# Patient Record
Sex: Female | Born: 1960 | Race: White | Hispanic: No | Marital: Married | Smoking: Current every day smoker
Health system: Southern US, Community
[De-identification: ages and names within clinical notes are randomized; demographics above are authoritative.]

## PROBLEM LIST (undated history)

## (undated) HISTORY — PX: FOOT SURGERY: SHX648

## (undated) HISTORY — PX: ABDOMINAL HYSTERECTOMY: SHX81

## (undated) HISTORY — PX: TUMOR REMOVAL: SHX12

---

## 2020-08-02 ENCOUNTER — Emergency Department
Admission: EM | Admit: 2020-08-02 | Discharge: 2020-08-02 | Disposition: A | Discharge status: Home / Self Care | Payer: 59 | Attending: Emergency Medicine | Admitting: Emergency Medicine

## 2020-08-02 ENCOUNTER — Emergency Department: Payer: 59

## 2020-08-02 DIAGNOSIS — Y9389 Activity, other specified: Secondary | ICD-10-CM | POA: Insufficient documentation

## 2020-08-02 DIAGNOSIS — Z23 Encounter for immunization: Secondary | ICD-10-CM | POA: Diagnosis not present

## 2020-08-02 DIAGNOSIS — Y9359 Activity, other involving other sports and athletics played individually: Secondary | ICD-10-CM | POA: Diagnosis not present

## 2020-08-02 DIAGNOSIS — W290XXA Contact with powered kitchen appliance, initial encounter: Secondary | ICD-10-CM | POA: Insufficient documentation

## 2020-08-02 DIAGNOSIS — Y92090 Kitchen in other non-institutional residence as the place of occurrence of the external cause: Secondary | ICD-10-CM | POA: Diagnosis not present

## 2020-08-02 DIAGNOSIS — S61311A Laceration without foreign body of left index finger with damage to nail, initial encounter: Secondary | ICD-10-CM | POA: Diagnosis not present

## 2020-08-02 DIAGNOSIS — Y998 Other external cause status: Secondary | ICD-10-CM | POA: Insufficient documentation

## 2020-08-02 MED ORDER — TETANUS-DIPHTH-ACELL PERTUSSIS 5-2.5-18.5 LF-MCG/0.5 IM SUSP
0.5000 mL | Freq: Once | INTRAMUSCULAR | Status: AC
  Administered 2020-08-02: 0.5 mL via INTRAMUSCULAR
  Filled 2020-08-02: qty 0.5

## 2020-08-02 MED ORDER — CEPHALEXIN 500 MG PO CAPS: 500.0000 mg | ORAL_CAPSULE | Freq: Two times a day (BID) | ORAL | 0 refills | Status: AC

## 2020-08-02 NOTE — ED Triage Notes (Signed)
Finger laceration to the left index from 1930 with an emersion blender in kitchen.

## 2020-08-02 NOTE — ED Notes (Signed)
Pt alert and oriented X 4, stable for discharge. RR even and unlabored, color WNL. Discussed discharge instructions and follow up when appropriate. Instructed to follow up with ER for any life threatening symptoms or concerns that patient or family of patient may have  

## 2020-08-02 NOTE — ED Provider Notes (Signed)
[facility] Emergency Department Provider Note  ____________________________________________  Time seen: Approximately 10:33 PM  I have reviewed the triage vital signs and the nursing notes.   HISTORY  Chief Complaint Finger Injury    HPI Donna Duke is a 59 y.o. female who presents the emergency department with a laceration to the left index finger after sustaining an injury while using a immersion blender.  Patient states that she was going to take the blade off when evidently it activated causing a laceration to the index.  She is able to move the finger freely.  It did do damage to the lateral nail bed.  Sensation intact distally.   Full range of motion.  Unsure of last tetanus shot.  No other injury or complaint.        No past medical history on file.  There are no problems to display for this patient.     Prior to Admission medications   Medication Sig Start Date End Date Taking? Authorizing Provider  cephALEXin (KEFLEX) 500 MG capsule Take 1 capsule (500 mg total) by mouth 2 (two) times daily. 08/02/20   Shanyiah Conde, Delorise Royals, PA-C    Allergies Patient has no known allergies.  No family history on file.  Social History Social History   Tobacco Use  . Smoking status: Not on file  Substance Use Topics  . Alcohol use: Not on file  . Drug use: Not on file     Review of Systems  Constitutional: No fever/chills Eyes: No visual changes. No discharge ENT: No upper respiratory complaints. Cardiovascular: no chest pain. Respiratory: no cough. No SOB. Gastrointestinal: No abdominal pain.  No nausea, no vomiting.   Musculoskeletal: Laceration to the left index finger Skin: Negative for rash, abrasions, lacerations, ecchymosis. Neurological: Negative for headaches, focal weakness or numbness. 10-point ROS otherwise negative.  ____________________________________________   PHYSICAL EXAM:  VITAL SIGNS: ED Triage Vitals  Enc Vitals  Group     BP      Pulse      Resp      Temp      Temp src      SpO2      Weight      Height      Head Circumference      Peak Flow      Pain Score      Pain Loc      Pain Edu?      Excl. in GC?      Constitutional: Alert and oriented. Well appearing and in no acute distress. Eyes: Conjunctivae are normal. PERRL. EOMI. Head: Atraumatic. ENT:      Ears:       Nose: No congestion/rhinnorhea.      Mouth/Throat: Mucous membranes are moist.  Neck: No stridor.    Cardiovascular: Normal rate, regular rhythm. Normal S1 and S2.  Good peripheral circulation. Respiratory: Normal respiratory effort without tachypnea or retractions. Lungs CTAB. Good air entry to the bases with no decreased or absent breath sounds. Musculoskeletal: Full range of motion to all extremities. No gross deformities appreciated.  Visualization of the index finger reveals well approximated laceration.  No active bleeding.  This does involve the lateral aspect of the nailbed.  No foreign body.  Nailbed is not loose.  Sensation and capillary refill intact distally.  Full range of motion. Neurologic:  Normal speech and language. No gross focal neurologic deficits are appreciated.  Skin:  Skin is warm, dry and intact. No rash noted. Psychiatric:  Mood and affect are normal. Speech and behavior are normal. Patient exhibits appropriate insight and judgement.   ____________________________________________   LABS (all labs ordered are listed, but only abnormal results are displayed)  Labs Reviewed - No data to display ____________________________________________  EKG   ____________________________________________  RADIOLOGY   DG Finger Index Left  Result Date: 08/02/2020 CLINICAL DATA:  Finger laceration EXAM: LEFT INDEX FINGER 2+V COMPARISON:  None. FINDINGS: There is no evidence of fracture or dislocation. There is no evidence of arthropathy or other focal bone abnormality. Soft tissues are unremarkable. No  radiopaque foreign body. IMPRESSION: No fracture or foreign body. Electronically Signed   By: Charlett Nose M.D.   On: 08/02/2020 22:12    ____________________________________________    PROCEDURES  Procedure(s) performed:    Marland KitchenMarland KitchenLaceration Repair  Date/Time: 08/02/2020 11:00 PM Performed by: Racheal Patches, PA-C Authorized by: Racheal Patches, PA-C   Consent:    Consent obtained:  Verbal   Consent given by:  Patient   Risks discussed:  Pain and infection Anesthesia (see MAR for exact dosages):    Anesthesia method:  None Laceration details:    Location:  Finger   Finger location:  L index finger   Length (cm):  1.5 Repair type:    Repair type:  Simple Exploration:    Hemostasis achieved with:  Direct pressure   Wound exploration: wound explored through full range of motion and entire depth of wound probed and visualized     Wound extent: no foreign bodies/material noted, no muscle damage noted, no nerve damage noted, no tendon damage noted, no underlying fracture noted and no vascular damage noted     Contaminated: no   Treatment:    Area cleansed with:  Shur-Clens   Amount of cleaning:  Standard Skin repair:    Repair method:  Tissue adhesive Approximation:    Approximation:  Close Post-procedure details:    Dressing:  Splint for protection   Patient tolerance of procedure:  Tolerated well, no immediate complications      Medications  Tdap (BOOSTRIX) injection 0.5 mL (0.5 mLs Intramuscular Given 08/02/20 2300)     ____________________________________________   INITIAL IMPRESSION / ASSESSMENT AND PLAN / ED COURSE  Pertinent labs & imaging results that were available during my care of the patient were reviewed by me and considered in my medical decision making (see chart for details).  Review of the [place] CSRS was performed in accordance of the [facility] prior to dispensing any controlled drugs.           Patient's diagnosis is consistent with  finger laceration.  Patient presented to emergency department after sustaining a laceration to the index finger the left hand.  This was amenable to closure with Dermabond and splint for protection.  Patient is given wound care instructions.  Antibiotics prophylactically.  Patient will have tetanus shot updated at this time.   Return precautions discussed with the patient.. Patient will be discharged home with prescriptions for Keflex. Patient is to follow up with primary care as needed or otherwise directed. Patient is given ED precautions to return to the ED for any worsening or new symptoms.     ____________________________________________  FINAL CLINICAL IMPRESSION(S) / ED DIAGNOSES  Final diagnoses:  Laceration of left index finger without foreign body with damage to nail, initial encounter      NEW MEDICATIONS STARTED DURING THIS VISIT:  ED Discharge Orders         Ordered    cephALEXin (  KEFLEX) 500 MG capsule  2 times daily     Discontinue  Reprint     08/02/20 2257              This chart was dictated using voice recognition software/Dragon. Despite best efforts to proofread, errors can occur which can change the meaning. Any change was purely unintentional.    Racheal Patches, PA-C 08/02/20 2301    Arnaldo Natal, MD 08/02/20 [phone]

## 2021-02-05 ENCOUNTER — Telehealth: Payer: Self-pay | Admitting: *Deleted

## 2021-02-05 NOTE — Telephone Encounter (Signed)
Received referral for low dose lung cancer screening CT scan. Message left at phone number listed in EMR for patient to call me back to facilitate scheduling scan.  

## 2021-03-12 ENCOUNTER — Telehealth: Payer: Self-pay | Admitting: *Deleted

## 2021-03-12 NOTE — Telephone Encounter (Signed)
Received referral for low dose lung cancer screening CT scan. Message left at phone number listed in EMR for patient to call me back to facilitate scheduling scan.  

## 2021-03-14 ENCOUNTER — Telehealth: Payer: Self-pay | Admitting: *Deleted

## 2021-03-14 DIAGNOSIS — Z87891 Personal history of nicotine dependence: Secondary | ICD-10-CM

## 2021-03-14 DIAGNOSIS — F17200 Nicotine dependence, unspecified, uncomplicated: Secondary | ICD-10-CM

## 2021-03-14 DIAGNOSIS — Z122 Encounter for screening for malignant neoplasm of respiratory organs: Secondary | ICD-10-CM

## 2021-03-14 NOTE — Telephone Encounter (Signed)
Received referral for initial lung cancer screening scan. Contacted patient and obtained smoking history,(current, (30 pack year) as well as answering questions related to screening process. Patient denies signs of lung cancer such as weight loss or hemoptysis. Patient denies comorbidity that would prevent curative treatment if lung cancer were found. Patient is scheduled for shared decision making visit and CT scan on 03/20/21 at 145pm.

## 2021-03-20 ENCOUNTER — Ambulatory Visit: Admission: RE | Admit: 2021-03-20 | Payer: 59 | Source: Home / Self Care

## 2021-03-20 ENCOUNTER — Inpatient Hospital Stay: Payer: 59 | Attending: Nurse Practitioner | Admitting: Nurse Practitioner

## 2021-03-20 ENCOUNTER — Ambulatory Visit
Admission: RE | Admit: 2021-03-20 | Discharge: 2021-03-20 | Disposition: A | Discharge status: Home / Self Care | Payer: 59 | Source: Ambulatory Visit | Attending: Nurse Practitioner | Admitting: Nurse Practitioner

## 2021-03-20 ENCOUNTER — Ambulatory Visit: Admit: 2021-03-20 | Payer: 59

## 2021-03-20 ENCOUNTER — Other Ambulatory Visit: Payer: Self-pay

## 2021-03-20 DIAGNOSIS — F17200 Nicotine dependence, unspecified, uncomplicated: Secondary | ICD-10-CM | POA: Diagnosis present

## 2021-03-20 DIAGNOSIS — Z122 Encounter for screening for malignant neoplasm of respiratory organs: Secondary | ICD-10-CM | POA: Diagnosis present

## 2021-03-20 DIAGNOSIS — Z87891 Personal history of nicotine dependence: Secondary | ICD-10-CM | POA: Diagnosis present

## 2021-03-20 NOTE — Progress Notes (Signed)
Virtual Visit via Video Enabled Telemedicine Note   I connected with Donna Duke on 03/20/21 at 1:45 PM EST by video enabled telemedicine visit and verified that I am speaking with the correct person using two identifiers.   I discussed the limitations, risks, security and privacy concerns of performing an evaluation and management service by telemedicine and the availability of in-person appointments. I also discussed with the patient that there may be a patient responsible charge related to this service. The patient expressed understanding and agreed to proceed.   Other persons participating in the visit and their role in the encounter: Donna Estelle, RN- checking in patient & navigation  Patient's location: [place] [facility] [place] location: Clinic  Chief Complaint: Low Dose CT Screening  Patient agreed to evaluation by telemedicine to discuss shared decision making for consideration of low dose CT lung cancer screening.    In accordance with CMS guidelines, patient has met eligibility criteria including age, absence of signs or symptoms of lung cancer.  Social History   Tobacco Use  . Smoking status: Current Every Day Smoker    Packs/day: 0.75    Years: 40.00    Pack years: 30.00    Types: Cigarettes  . Smokeless tobacco: Not on file  Substance Use Topics  . Alcohol use: Not on file     A shared decision-making session was conducted prior to the performance of CT scan. This includes one or more decision aids, includes benefits and harms of screening, follow-up diagnostic testing, over-diagnosis, false positive rate, and total radiation exposure.   Counseling on the importance of adherence to annual lung cancer LDCT screening, impact of co-morbidities, and ability or willingness to undergo diagnosis and treatment is imperative for compliance of the program.   Counseling on the importance of continued smoking cessation for former smokers; the importance of smoking  cessation for current smokers, and information about tobacco cessation interventions have been given to patient including [place] and [employer] [place] programs.   Written order for lung cancer screening with LDCT has been given to the patient and any and all questions have been answered to the best of my abilities.    Yearly follow up will be coordinated by Donna Duke, Thoracic Navigator.  I discussed the assessment and treatment plan with the patient. The patient was provided an opportunity to ask questions and all were answered. The patient agreed with the plan and demonstrated an understanding of the instructions.   The patient was advised to call back or seek an in-person evaluation if the symptoms worsen or if the condition fails to improve as anticipated.   I provided 15 minutes of face-to-face video visit time dedicated to the care of this patient on the date of this encounter to include pre-visit review of smoking history, face-to-face time with the patient, and post visit ordering of testing/documentation.   Donna Rutter, DNP, AGNP-C [place] at [facility] [phone] (clinic)

## 2021-03-28 ENCOUNTER — Telehealth: Payer: Self-pay | Admitting: *Deleted

## 2021-03-28 NOTE — Telephone Encounter (Signed)

## 2021-12-20 IMAGING — CT CT CHEST LUNG CANCER SCREENING LOW DOSE W/O CM
1 series · 15 of 31 positions shown, 19 images · non-contrast
Comparison: None.

CLINICAL DATA: 30 pack-year smoking history.  Current smoker.

EXAM:
CT CHEST WITHOUT CONTRAST LOW-DOSE FOR LUNG CANCER SCREENING
TECHNIQUE: Multidetector CT imaging of the chest was performed following the
standard protocol without IV contrast.

[Series 2: axial st · axial · 0.72mm/px · z∈[-889,-624]mm · 15 of 59 slices shown, 19 images]
[im 3/59  mediastinal]
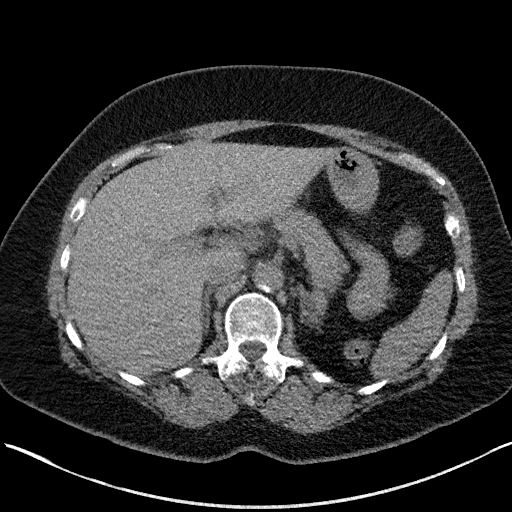
[im 3/59  lung]
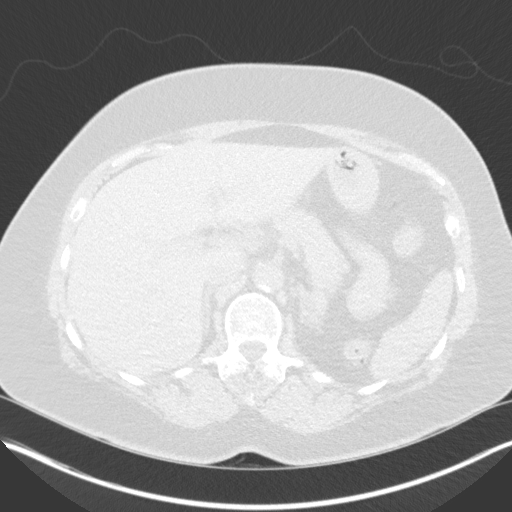
[im 7/59  lung]
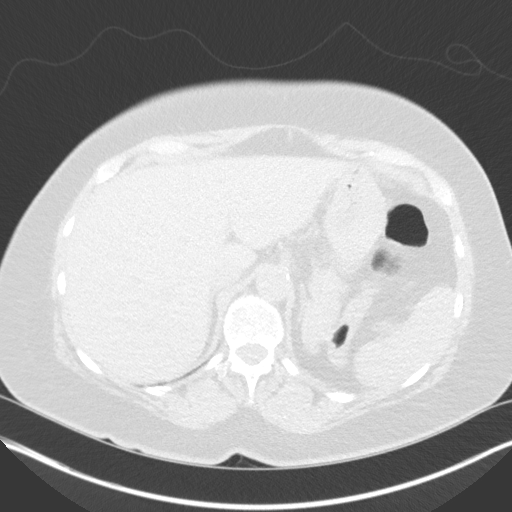
[im 11/59  lung]
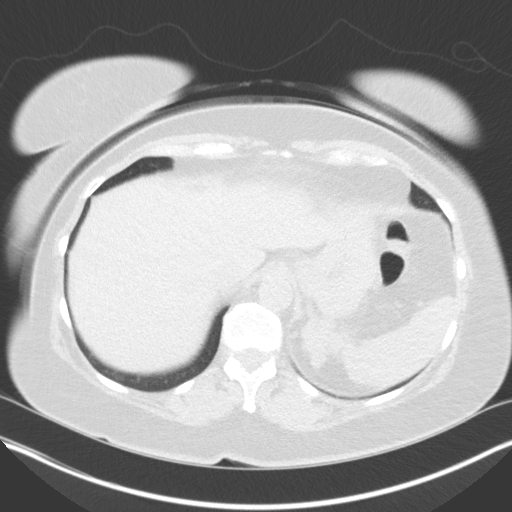
[im 13/59  lung]
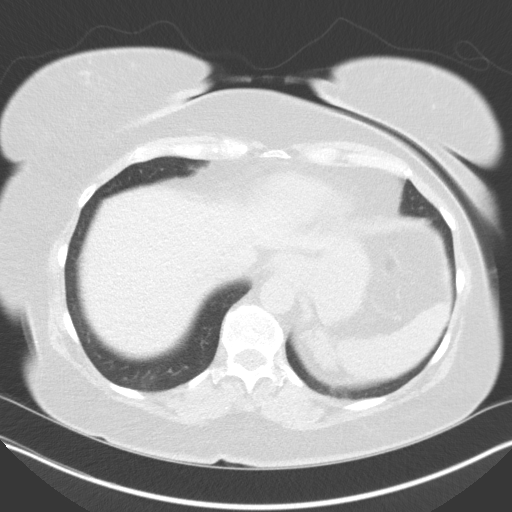
[im 18/59  mediastinal]
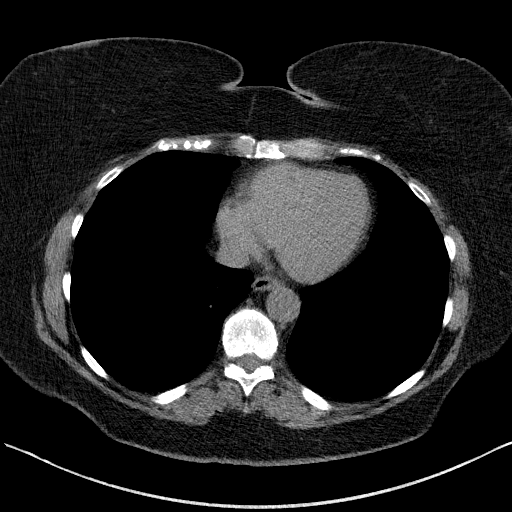
[im 18/59  lung]
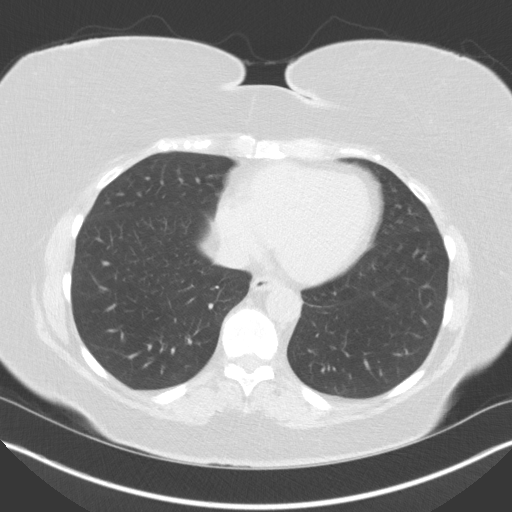
[im 22/59  lung]
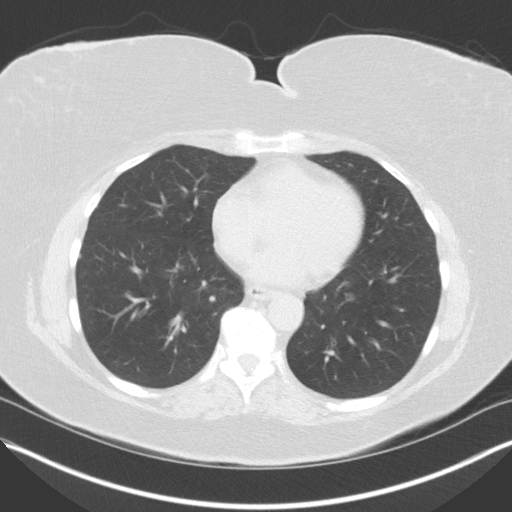
[im 26/59  lung]
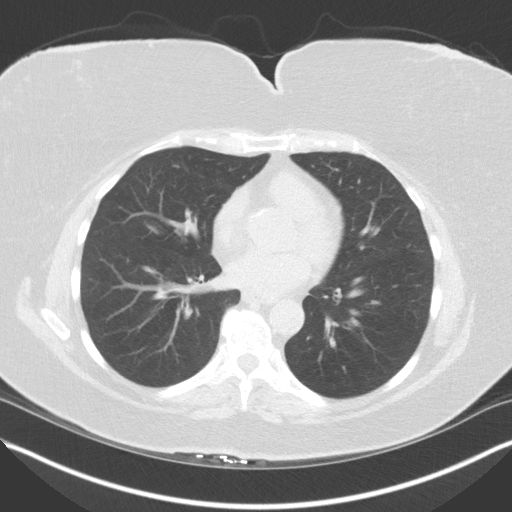
[im 31/59  lung]
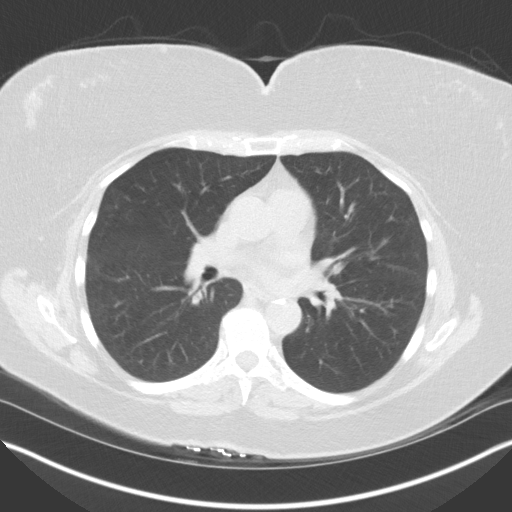
[im 33/59  mediastinal]
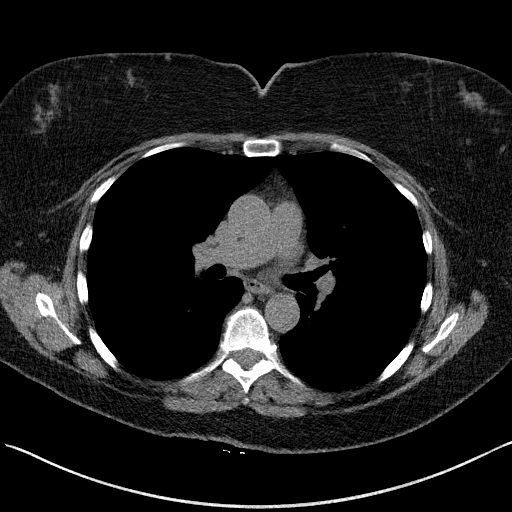
[im 33/59  lung]
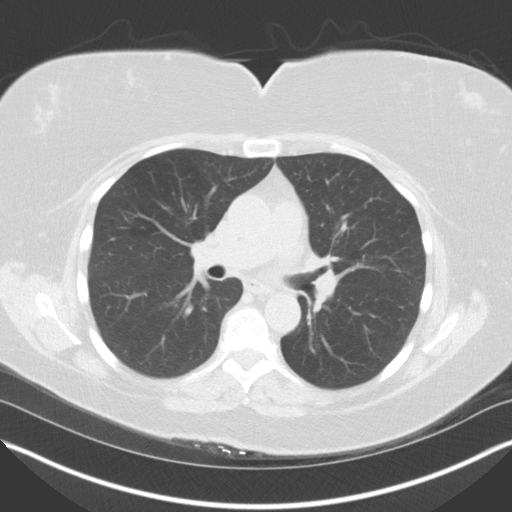
[im 37/59  lung]
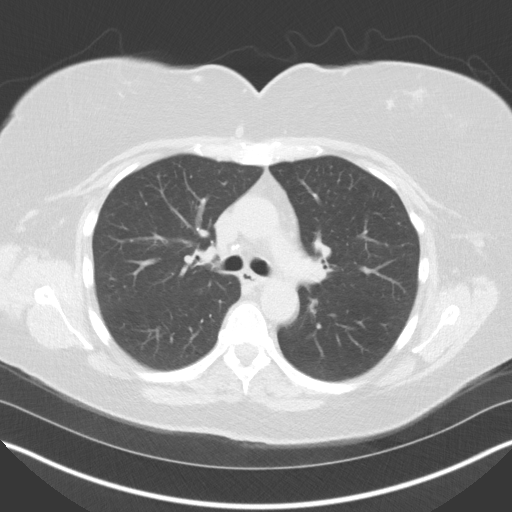
[im 41/59  lung]
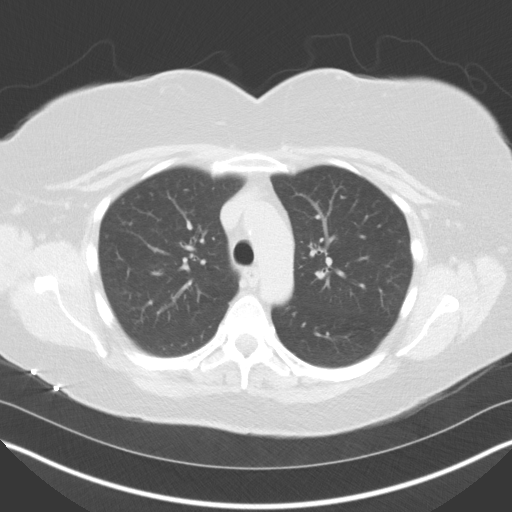
[im 46/59  lung]
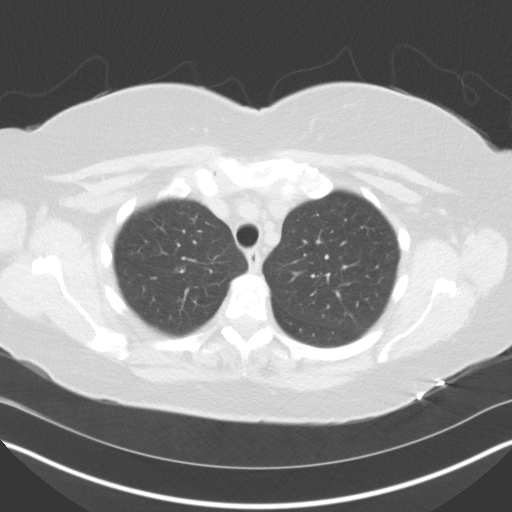
[im 48/59  mediastinal]
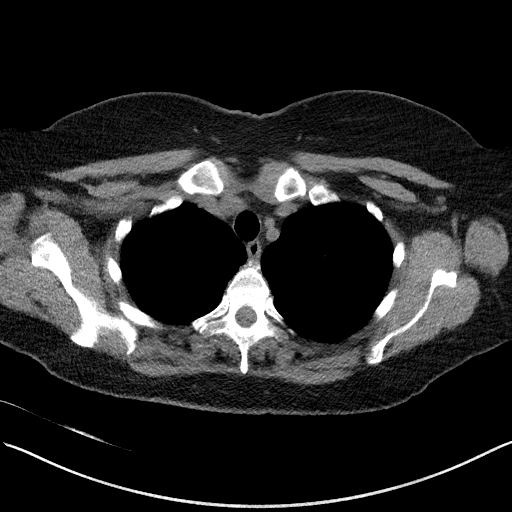
[im 48/59  lung]
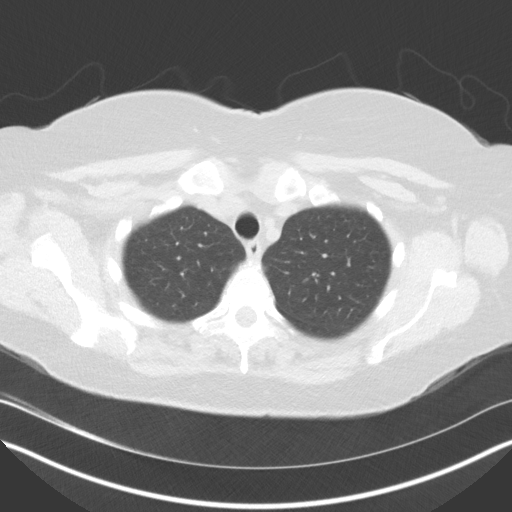
[im 52/59  lung]
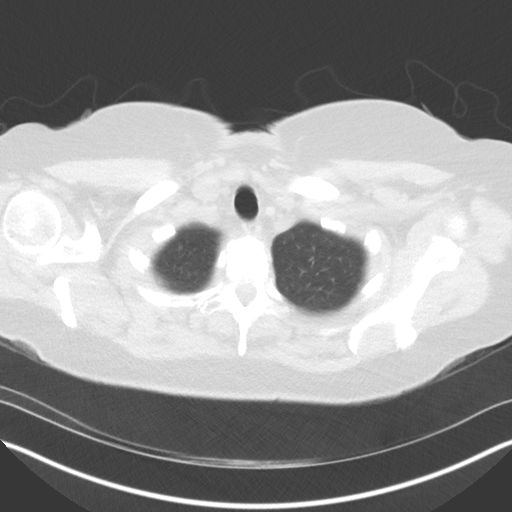
[im 56/59  lung]
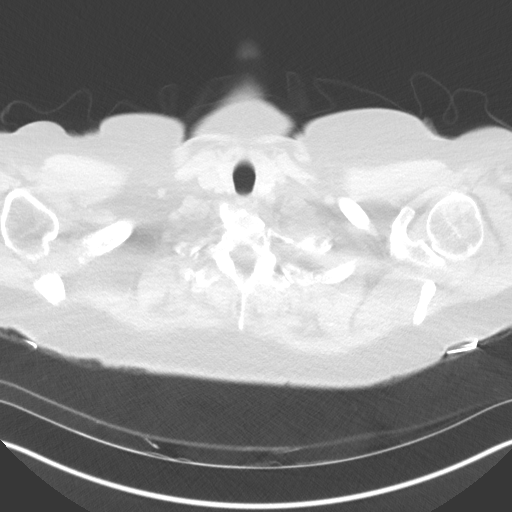

[15 of 31 positions shown; findings below may reference images not displayed]

FINDINGS: Cardiovascular: Aortic atherosclerosis. Normal heart size, without
pericardial effusion.

Mediastinum/Nodes: Calcified middle mediastinal node is likely
related to old granulomatous disease. Hilar regions poorly evaluated
without intravenous contrast.

Lungs/Pleura: No pleural fluid. mild centrilobular emphysema.
Subpleural predominant pulmonary nodules may represent subpleural
lymph nodes. These measure maximally volume derived equivalent
diameter 5.2 mm.

Upper Abdomen: Normal imaged portions of the liver, spleen, stomach,
pancreas, adrenal glands.

Musculoskeletal: No acute osseous abnormality.
IMPRESSION: 1. Lung-RADS 2, benign appearance or behavior. Continue annual
screening with low-dose chest CT without contrast in 12 months.
2. Aortic Atherosclerosis (RY5FS-OMH.H) and Emphysema (RY5FS-599.X).

## 2022-03-22 ENCOUNTER — Telehealth: Payer: Self-pay | Admitting: *Deleted

## 2022-03-22 NOTE — Telephone Encounter (Signed)
LMTC to schedule Yearly Lung CA CT Scan. 

## 2022-05-17 ENCOUNTER — Other Ambulatory Visit: Payer: Self-pay | Admitting: Infectious Diseases

## 2022-05-17 DIAGNOSIS — Z1231 Encounter for screening mammogram for malignant neoplasm of breast: Secondary | ICD-10-CM

## 2022-05-31 ENCOUNTER — Telehealth: Payer: Self-pay | Admitting: *Deleted

## 2022-05-31 NOTE — Telephone Encounter (Signed)
Left another message for pt to call back to schedule yearly lung screening CT scan.  ?

## 2022-06-05 ENCOUNTER — Ambulatory Visit
Admission: RE | Admit: 2022-06-05 | Discharge: 2022-06-05 | Disposition: A | Discharge status: Home / Self Care | Payer: BC Managed Care – PPO | Source: Ambulatory Visit | Attending: Infectious Diseases | Admitting: Infectious Diseases

## 2022-06-05 ENCOUNTER — Encounter: Payer: Self-pay | Admitting: Radiology

## 2022-06-05 DIAGNOSIS — Z1231 Encounter for screening mammogram for malignant neoplasm of breast: Secondary | ICD-10-CM | POA: Insufficient documentation

## 2022-06-24 ENCOUNTER — Inpatient Hospital Stay
Admission: RE | Admit: 2022-06-24 | Discharge: 2022-06-24 | Disposition: A | Discharge status: Home / Self Care | Payer: Self-pay | Source: Ambulatory Visit | Attending: *Deleted | Admitting: *Deleted

## 2022-06-24 ENCOUNTER — Other Ambulatory Visit: Payer: Self-pay | Admitting: *Deleted

## 2022-06-24 DIAGNOSIS — Z1231 Encounter for screening mammogram for malignant neoplasm of breast: Secondary | ICD-10-CM

## 2022-06-26 ENCOUNTER — Telehealth: Payer: Self-pay | Admitting: *Deleted

## 2022-06-26 ENCOUNTER — Other Ambulatory Visit: Payer: Self-pay | Admitting: Infectious Diseases

## 2022-06-26 DIAGNOSIS — R928 Other abnormal and inconclusive findings on diagnostic imaging of breast: Secondary | ICD-10-CM

## 2022-06-26 DIAGNOSIS — N6489 Other specified disorders of breast: Secondary | ICD-10-CM

## 2022-06-26 NOTE — Telephone Encounter (Signed)
Left another message for patient to call back to schedule follow up lung cancer screening CT scan.

## 2022-07-01 ENCOUNTER — Other Ambulatory Visit: Payer: Self-pay

## 2022-07-01 DIAGNOSIS — Z122 Encounter for screening for malignant neoplasm of respiratory organs: Secondary | ICD-10-CM

## 2022-07-01 DIAGNOSIS — F17210 Nicotine dependence, cigarettes, uncomplicated: Secondary | ICD-10-CM

## 2022-07-01 DIAGNOSIS — Z87891 Personal history of nicotine dependence: Secondary | ICD-10-CM

## 2022-07-16 ENCOUNTER — Ambulatory Visit
Admission: RE | Admit: 2022-07-16 | Discharge: 2022-07-16 | Disposition: A | Discharge status: Home / Self Care | Payer: BC Managed Care – PPO | Source: Ambulatory Visit | Attending: Acute Care | Admitting: Acute Care

## 2022-07-16 DIAGNOSIS — I700 Atherosclerosis of aorta: Secondary | ICD-10-CM | POA: Diagnosis not present

## 2022-07-16 DIAGNOSIS — I2510 Atherosclerotic heart disease of native coronary artery without angina pectoris: Secondary | ICD-10-CM | POA: Insufficient documentation

## 2022-07-16 DIAGNOSIS — J439 Emphysema, unspecified: Secondary | ICD-10-CM | POA: Insufficient documentation

## 2022-07-16 DIAGNOSIS — Z122 Encounter for screening for malignant neoplasm of respiratory organs: Secondary | ICD-10-CM | POA: Insufficient documentation

## 2022-07-16 DIAGNOSIS — Z87891 Personal history of nicotine dependence: Secondary | ICD-10-CM

## 2022-07-16 DIAGNOSIS — F17210 Nicotine dependence, cigarettes, uncomplicated: Secondary | ICD-10-CM | POA: Diagnosis not present

## 2022-07-17 ENCOUNTER — Ambulatory Visit
Admission: RE | Admit: 2022-07-17 | Discharge: 2022-07-17 | Disposition: A | Discharge status: Home / Self Care | Payer: BC Managed Care – PPO | Source: Ambulatory Visit | Attending: Infectious Diseases | Admitting: Infectious Diseases

## 2022-07-17 ENCOUNTER — Other Ambulatory Visit: Payer: Self-pay

## 2022-07-17 DIAGNOSIS — F17210 Nicotine dependence, cigarettes, uncomplicated: Secondary | ICD-10-CM

## 2022-07-17 DIAGNOSIS — R928 Other abnormal and inconclusive findings on diagnostic imaging of breast: Secondary | ICD-10-CM

## 2022-07-17 DIAGNOSIS — N6489 Other specified disorders of breast: Secondary | ICD-10-CM | POA: Diagnosis present

## 2022-07-17 DIAGNOSIS — Z122 Encounter for screening for malignant neoplasm of respiratory organs: Secondary | ICD-10-CM

## 2022-07-17 DIAGNOSIS — Z87891 Personal history of nicotine dependence: Secondary | ICD-10-CM

## 2022-08-15 NOTE — Addendum Note (Signed)
Encounter addended by: Silvana Newness on: 08/15/2022 11:01 AM  Actions taken: Imaging Exam ended

## 2023-05-20 ENCOUNTER — Other Ambulatory Visit: Payer: Self-pay | Admitting: Infectious Diseases

## 2023-05-20 DIAGNOSIS — Z1231 Encounter for screening mammogram for malignant neoplasm of breast: Secondary | ICD-10-CM

## 2023-05-23 ENCOUNTER — Other Ambulatory Visit: Payer: Self-pay | Admitting: Acute Care

## 2023-05-23 DIAGNOSIS — Z122 Encounter for screening for malignant neoplasm of respiratory organs: Secondary | ICD-10-CM

## 2023-05-23 DIAGNOSIS — Z87891 Personal history of nicotine dependence: Secondary | ICD-10-CM

## 2023-05-23 DIAGNOSIS — F17210 Nicotine dependence, cigarettes, uncomplicated: Secondary | ICD-10-CM

## 2023-06-17 LAB — COLOGUARD: COLOGUARD: NEGATIVE

## 2023-07-23 ENCOUNTER — Ambulatory Visit
Admission: RE | Admit: 2023-07-23 | Discharge: 2023-07-23 | Disposition: A | Discharge status: Home / Self Care | Payer: BC Managed Care – PPO | Source: Ambulatory Visit | Attending: Infectious Diseases | Admitting: Infectious Diseases

## 2023-07-23 DIAGNOSIS — Z1231 Encounter for screening mammogram for malignant neoplasm of breast: Secondary | ICD-10-CM | POA: Diagnosis present

## 2023-07-29 ENCOUNTER — Ambulatory Visit
Admission: RE | Admit: 2023-07-29 | Discharge: 2023-07-29 | Disposition: A | Discharge status: Home / Self Care | Payer: BC Managed Care – PPO | Source: Ambulatory Visit | Attending: Infectious Diseases | Admitting: Infectious Diseases

## 2023-07-29 DIAGNOSIS — I2510 Atherosclerotic heart disease of native coronary artery without angina pectoris: Secondary | ICD-10-CM | POA: Insufficient documentation

## 2023-07-29 DIAGNOSIS — Z87891 Personal history of nicotine dependence: Secondary | ICD-10-CM

## 2023-07-29 DIAGNOSIS — I700 Atherosclerosis of aorta: Secondary | ICD-10-CM | POA: Diagnosis not present

## 2023-07-29 DIAGNOSIS — F17210 Nicotine dependence, cigarettes, uncomplicated: Secondary | ICD-10-CM | POA: Diagnosis not present

## 2023-07-29 DIAGNOSIS — J439 Emphysema, unspecified: Secondary | ICD-10-CM | POA: Insufficient documentation

## 2023-07-29 DIAGNOSIS — Z122 Encounter for screening for malignant neoplasm of respiratory organs: Secondary | ICD-10-CM | POA: Insufficient documentation

## 2023-08-04 ENCOUNTER — Other Ambulatory Visit: Payer: Self-pay

## 2023-08-04 DIAGNOSIS — Z87891 Personal history of nicotine dependence: Secondary | ICD-10-CM

## 2023-08-04 DIAGNOSIS — Z122 Encounter for screening for malignant neoplasm of respiratory organs: Secondary | ICD-10-CM

## 2023-08-04 DIAGNOSIS — F17210 Nicotine dependence, cigarettes, uncomplicated: Secondary | ICD-10-CM

## 2024-05-21 ENCOUNTER — Other Ambulatory Visit: Payer: Self-pay | Admitting: Infectious Diseases

## 2024-05-21 DIAGNOSIS — K219 Gastro-esophageal reflux disease without esophagitis: Secondary | ICD-10-CM

## 2024-05-21 DIAGNOSIS — R7303 Prediabetes: Secondary | ICD-10-CM

## 2024-05-21 DIAGNOSIS — Z1231 Encounter for screening mammogram for malignant neoplasm of breast: Secondary | ICD-10-CM

## 2024-05-21 DIAGNOSIS — Z1331 Encounter for screening for depression: Secondary | ICD-10-CM

## 2024-05-21 DIAGNOSIS — I2510 Atherosclerotic heart disease of native coronary artery without angina pectoris: Secondary | ICD-10-CM

## 2024-05-21 DIAGNOSIS — I10 Essential (primary) hypertension: Secondary | ICD-10-CM

## 2024-05-21 DIAGNOSIS — Z0000 Encounter for general adult medical examination without abnormal findings: Secondary | ICD-10-CM

## 2024-05-21 DIAGNOSIS — E785 Hyperlipidemia, unspecified: Secondary | ICD-10-CM

## 2024-05-28 ENCOUNTER — Ambulatory Visit
Admission: RE | Admit: 2024-05-28 | Discharge: 2024-05-28 | Disposition: A | Discharge status: Home / Self Care | Payer: Self-pay | Source: Ambulatory Visit | Attending: Infectious Diseases | Admitting: Infectious Diseases

## 2024-05-28 DIAGNOSIS — Z0000 Encounter for general adult medical examination without abnormal findings: Secondary | ICD-10-CM | POA: Insufficient documentation

## 2024-05-28 DIAGNOSIS — I2510 Atherosclerotic heart disease of native coronary artery without angina pectoris: Secondary | ICD-10-CM | POA: Insufficient documentation

## 2024-05-28 DIAGNOSIS — Z1231 Encounter for screening mammogram for malignant neoplasm of breast: Secondary | ICD-10-CM | POA: Insufficient documentation

## 2024-05-28 DIAGNOSIS — K219 Gastro-esophageal reflux disease without esophagitis: Secondary | ICD-10-CM | POA: Insufficient documentation

## 2024-05-28 DIAGNOSIS — R7303 Prediabetes: Secondary | ICD-10-CM | POA: Insufficient documentation

## 2024-05-28 DIAGNOSIS — I10 Essential (primary) hypertension: Secondary | ICD-10-CM | POA: Insufficient documentation

## 2024-05-28 DIAGNOSIS — E785 Hyperlipidemia, unspecified: Secondary | ICD-10-CM | POA: Insufficient documentation

## 2024-05-28 DIAGNOSIS — Z1331 Encounter for screening for depression: Secondary | ICD-10-CM | POA: Insufficient documentation

## 2024-07-15 ENCOUNTER — Encounter: Payer: Self-pay | Admitting: Acute Care

## 2024-07-23 ENCOUNTER — Ambulatory Visit
Admission: RE | Admit: 2024-07-23 | Discharge: 2024-07-23 | Disposition: A | Discharge status: Home / Self Care | Source: Ambulatory Visit | Attending: Infectious Diseases | Admitting: Infectious Diseases

## 2024-07-23 DIAGNOSIS — Z1231 Encounter for screening mammogram for malignant neoplasm of breast: Secondary | ICD-10-CM | POA: Insufficient documentation

## 2024-07-28 ENCOUNTER — Other Ambulatory Visit: Payer: Self-pay | Admitting: Acute Care

## 2024-07-28 DIAGNOSIS — Z87891 Personal history of nicotine dependence: Secondary | ICD-10-CM

## 2024-07-28 DIAGNOSIS — F17210 Nicotine dependence, cigarettes, uncomplicated: Secondary | ICD-10-CM

## 2024-07-28 DIAGNOSIS — Z122 Encounter for screening for malignant neoplasm of respiratory organs: Secondary | ICD-10-CM

## 2024-08-06 ENCOUNTER — Ambulatory Visit
Admission: RE | Admit: 2024-08-06 | Discharge: 2024-08-06 | Disposition: A | Discharge status: Home / Self Care | Source: Ambulatory Visit | Attending: Infectious Diseases | Admitting: Infectious Diseases

## 2024-08-06 DIAGNOSIS — F17210 Nicotine dependence, cigarettes, uncomplicated: Secondary | ICD-10-CM | POA: Diagnosis present

## 2024-08-06 DIAGNOSIS — Z122 Encounter for screening for malignant neoplasm of respiratory organs: Secondary | ICD-10-CM | POA: Insufficient documentation

## 2024-08-06 DIAGNOSIS — Z87891 Personal history of nicotine dependence: Secondary | ICD-10-CM | POA: Insufficient documentation

## 2024-08-24 ENCOUNTER — Other Ambulatory Visit: Payer: Self-pay

## 2024-08-24 DIAGNOSIS — Z87891 Personal history of nicotine dependence: Secondary | ICD-10-CM

## 2024-08-24 DIAGNOSIS — Z122 Encounter for screening for malignant neoplasm of respiratory organs: Secondary | ICD-10-CM

## 2024-08-24 DIAGNOSIS — F17210 Nicotine dependence, cigarettes, uncomplicated: Secondary | ICD-10-CM

## 2024-12-10 ENCOUNTER — Other Ambulatory Visit: Payer: Self-pay

## 2024-12-10 ENCOUNTER — Emergency Department
Admission: EM | Admit: 2024-12-10 | Discharge: 2024-12-10 | Disposition: A | Discharge status: Home / Self Care | Attending: Emergency Medicine | Admitting: Emergency Medicine

## 2024-12-10 ENCOUNTER — Emergency Department

## 2024-12-10 DIAGNOSIS — R079 Chest pain, unspecified: Secondary | ICD-10-CM | POA: Insufficient documentation

## 2024-12-10 DIAGNOSIS — I10 Essential (primary) hypertension: Secondary | ICD-10-CM | POA: Insufficient documentation

## 2024-12-10 DIAGNOSIS — R531 Weakness: Secondary | ICD-10-CM | POA: Insufficient documentation

## 2024-12-10 DIAGNOSIS — Z87891 Personal history of nicotine dependence: Secondary | ICD-10-CM | POA: Diagnosis not present

## 2024-12-10 DIAGNOSIS — R0602 Shortness of breath: Secondary | ICD-10-CM | POA: Diagnosis not present

## 2024-12-10 LAB — CBC
HCT: 41.4 % (ref 36.0–46.0)
Hemoglobin: 13.4 g/dL (ref 12.0–15.0)
MCH: 29.4 pg (ref 26.0–34.0)
MCHC: 32.4 g/dL (ref 30.0–36.0)
MCV: 90.8 fL (ref 80.0–100.0)
Platelets: 210 K/uL (ref 150–400)
RBC: 4.56 MIL/uL (ref 3.87–5.11)
RDW: 12.2 % (ref 11.5–15.5)
WBC: 5.7 K/uL (ref 4.0–10.5)
nRBC: 0 % (ref 0.0–0.2)

## 2024-12-10 LAB — HEPATIC FUNCTION PANEL
ALT: 35 U/L (ref 0–44)
AST: 23 U/L (ref 15–41)
Albumin: 4.3 g/dL (ref 3.5–5.0)
Alkaline Phosphatase: 66 U/L (ref 38–126)
Bilirubin, Direct: 0.2 mg/dL (ref 0.0–0.2)
Indirect Bilirubin: 0.2 mg/dL — ABNORMAL LOW (ref 0.3–0.9)
Total Bilirubin: 0.4 mg/dL (ref 0.0–1.2)
Total Protein: 6.8 g/dL (ref 6.5–8.1)

## 2024-12-10 LAB — BASIC METABOLIC PANEL WITH GFR
Anion gap: 11 (ref 5–15)
BUN: 13 mg/dL (ref 8–23)
CO2: 27 mmol/L (ref 22–32)
Calcium: 9.6 mg/dL (ref 8.9–10.3)
Chloride: 104 mmol/L (ref 98–111)
Creatinine, Ser: 0.7 mg/dL (ref 0.44–1.00)
GFR, Estimated: >60 mL/min
Glucose, Bld: 93 mg/dL (ref 70–99)
Potassium: 4.5 mmol/L (ref 3.5–5.1)
Sodium: 142 mmol/L (ref 135–145)

## 2024-12-10 LAB — LIPASE, BLOOD: Lipase: 32 U/L (ref 11–51)

## 2024-12-10 LAB — TROPONIN T, HIGH SENSITIVITY: Troponin T High Sensitivity: <15 ng/L (ref 0–19)

## 2024-12-10 MED ORDER — PANTOPRAZOLE SODIUM 40 MG IV SOLR
40.0000 mg | Freq: Once | INTRAVENOUS | Status: AC
  Administered 2024-12-10: 40 mg via INTRAVENOUS
  Filled 2024-12-10: qty 10

## 2024-12-10 MED ORDER — IOHEXOL 350 MG/ML SOLN
75.0000 mL | Freq: Once | INTRAVENOUS | Status: AC | PRN
  Administered 2024-12-10: 75 mL via INTRAVENOUS

## 2024-12-10 MED ORDER — SODIUM CHLORIDE 0.9 % IV BOLUS
500.0000 mL | Freq: Once | INTRAVENOUS | Status: AC
  Administered 2024-12-10: 500 mL via INTRAVENOUS

## 2024-12-10 NOTE — ED Provider Notes (Signed)
 "  [facility] Provider Note    Event Date/Time   First MD Initiated Contact with Patient 12/10/24 1651     (approximate)   History   Chest Pain   HPI  Donna Duke is a 63 y.o. female history of hypertension, hypercholesterolemia, GERD presents emergency department with intermittent left chest pain for 1 week.  Increased in persistency overnight.  Some shortness of breath on exertion and with the pain.  Also had some weakness.  Was concerned she might be having a heart attack.  No recent travel, quit smoking 6 months ago, no known injury.  Patient also did cut back on her Protonix  because she was worried about her calcium levels.  Has noticed her GERD has increased with this decrease in medication.      Physical Exam   Triage Vital Signs: ED Triage Vitals  Encounter Vitals Group     BP 12/10/24 1358 (!) 153/88     Girls Systolic BP Percentile --      Girls Diastolic BP Percentile --      Boys Systolic BP Percentile --      Boys Diastolic BP Percentile --      Pulse Rate 12/10/24 1358 64     Resp 12/10/24 1358 18     Temp 12/10/24 1358 97.9 F (36.6 C)     Temp Source 12/10/24 1358 Oral     SpO2 12/10/24 1358 97 %     Weight 12/10/24 1353 195 lb (88.5 kg)     Height 12/10/24 1353 5' 5 (1.651 m)     Head Circumference --      Peak Flow --      Pain Score 12/10/24 1353 3     Pain Loc --      Pain Education --      Exclude from Growth Chart --     Most recent vital signs: Vitals:   12/10/24 1358 12/10/24 1752  BP: (!) 153/88 (!) 148/86  Pulse: 64 65  Resp: 18 18  Temp: 97.9 F (36.6 C) 98 F (36.7 C)  SpO2: 97% 97%     General: Awake, no distress.   CV:  Good peripheral perfusion. regular rate and  rhythm Resp:  Normal effort. Lungs CTA Abd:  No distention.   Other:      ED Results / Procedures / Treatments   Labs (all labs ordered are listed, but only abnormal results are displayed) Labs Reviewed  BASIC METABOLIC PANEL WITH  GFR  CBC  HEPATIC FUNCTION PANEL  LIPASE, BLOOD  TROPONIN T, HIGH SENSITIVITY  TROPONIN T, HIGH SENSITIVITY     EKG  EKG   RADIOLOGY Chest x-ray, CTA PE    PROCEDURES:   Procedures  Critical Care:  no Chief Complaint  Patient presents with   Chest Pain      MEDICATIONS ORDERED IN ED: Medications  pantoprazole  (PROTONIX ) injection 40 mg (40 mg Intravenous Given 12/10/24 1825)  sodium chloride  0.9 % bolus 500 mL (0 mLs Intravenous Stopped 12/10/24 2010)  iohexol  (OMNIPAQUE ) 350 MG/ML injection 75 mL (75 mLs Intravenous Contrast Given 12/10/24 1838)     IMPRESSION / MDM / ASSESSMENT AND PLAN / ED COURSE  I reviewed the triage vital signs and the nursing notes.                              Differential diagnosis includes, but is not limited to, MI, NSTEMI,  angina, GERD, pancreatitis, acute cholecystitis, PE, chest wall pain, CAP  Patient's presentation is most consistent with acute illness / injury with system symptoms.   Medications given: Protonix  40 mg IV  EKG shows normal sinus rhythm, interpreted as being negative, no STEMI, see physician read   Chest x-ray, independent review interpretation by me as being negative for any acute abnormality  Initial labs reassuring with normal troponin, metabolic panel and CBC.  In shared decision making, I did go over all reasons for chest pain and shortness of breath with the patient.  She agrees to do CTA for PE due to chest pain associated with shortness of breath.  CTA for PE, independent review and interpretation by me as being negative for any acute abnormality.    Patient have mild relief with Protonix .  I did encourage her to go back to dosing her Protonix  twice daily.  Encouraged her to take her calcium at least 1 hour prior to taking Protonix  so she can digest it prior to taking the medication.  This would help in absorption of the calcium.  She is also to follow-up with cardiology.  Patient wants to talk to  her regular doctor and get a referral from them due to her insurance.  As long as she has strict follow-up I am okay with this.  However if she is worsening she needs to return emergency department immediately.  She is in agreement treatment plan.  Discharged stable condition.  No further red flags to continue workup    FINAL CLINICAL IMPRESSION(S) / ED DIAGNOSES   Final diagnoses:  Nonspecific chest pain     Rx / DC Orders   ED Discharge Orders     None        Note:  This document was prepared using Dragon voice recognition software and may include unintentional dictation errors.    Gasper Devere ORN, PA-C 12/10/24 2307    Dicky Anes, MD 12/20/24 2048  "

## 2024-12-10 NOTE — ED Triage Notes (Addendum)
 Patient c/o intermittent left chest pain X 1 week that increased in consistency last night. Reports accompanying symptoms of SOB and weakness.   Patient sent by UC; was given 4 baby aspirin by UC provider.
